# Patient Record
Sex: Male | Born: 1970 | Race: Black or African American | Hispanic: No | Marital: Single | Smoking: Current every day smoker
Health system: Southern US, Community
[De-identification: ages and names within clinical notes are randomized; demographics above are authoritative.]

## PROBLEM LIST (undated history)

## (undated) DIAGNOSIS — I10 Essential (primary) hypertension: Secondary | ICD-10-CM

## (undated) HISTORY — DX: Morbid (severe) obesity due to excess calories: E66.01

---

## 2005-02-20 ENCOUNTER — Emergency Department (HOSPITAL_COMMUNITY): Admission: EM | Admit: 2005-02-20 | Discharge: 2005-02-20 | Payer: Self-pay | Admitting: Emergency Medicine

## 2005-12-04 ENCOUNTER — Emergency Department (HOSPITAL_COMMUNITY): Admission: EM | Admit: 2005-12-04 | Discharge: 2005-12-04 | Payer: Self-pay | Admitting: Emergency Medicine

## 2015-11-10 ENCOUNTER — Encounter (HOSPITAL_COMMUNITY): Payer: Self-pay

## 2015-11-10 ENCOUNTER — Ambulatory Visit (INDEPENDENT_AMBULATORY_CARE_PROVIDER_SITE_OTHER): Payer: Self-pay

## 2015-11-10 ENCOUNTER — Ambulatory Visit (HOSPITAL_COMMUNITY)
Admission: EM | Admit: 2015-11-10 | Discharge: 2015-11-10 | Disposition: A | Discharge status: Home / Self Care | Payer: Self-pay | Attending: Family Medicine | Admitting: Family Medicine

## 2015-11-10 DIAGNOSIS — K5909 Other constipation: Secondary | ICD-10-CM

## 2015-11-10 DIAGNOSIS — R195 Other fecal abnormalities: Secondary | ICD-10-CM

## 2015-11-10 DIAGNOSIS — K591 Functional diarrhea: Secondary | ICD-10-CM

## 2015-11-10 DIAGNOSIS — I10 Essential (primary) hypertension: Secondary | ICD-10-CM

## 2015-11-10 HISTORY — DX: Essential (primary) hypertension: I10

## 2015-11-10 LAB — POCT I-STAT, CHEM 8
BUN: 9 mg/dL (ref 6–20)
CALCIUM ION: 1.13 mmol/L (ref 1.12–1.23)
CHLORIDE: 105 mmol/L (ref 101–111)
Creatinine, Ser: 0.9 mg/dL (ref 0.61–1.24)
GLUCOSE: 84 mg/dL (ref 65–99)
HCT: 46 % (ref 39.0–52.0)
Hemoglobin: 15.6 g/dL (ref 13.0–17.0)
Potassium: 3.8 mmol/L (ref 3.5–5.1)
SODIUM: 141 mmol/L (ref 135–145)
TCO2: 25 mmol/L (ref 0–100)

## 2015-11-10 MED ORDER — CLONIDINE HCL 0.1 MG PO TABS
0.1000 mg | ORAL_TABLET | Freq: Once | ORAL | Status: AC
  Administered 2015-11-10: 0.1 mg via ORAL

## 2015-11-10 MED ORDER — CLONIDINE HCL 0.1 MG PO TABS
ORAL_TABLET | ORAL | Status: AC
  Filled 2015-11-10: qty 1

## 2015-11-10 NOTE — Discharge Instructions (Signed)
It is a pleasure to see you today.    I am concerned about the changes in your bowel habits, and the straining with diarrhea and blood. THIS NEEDS TO BE EVALUATED FURTHER WITH THE HELP OF A PRIMARY DOCTOR.  I am giving you a contact to a local family practice that can work with you to establish care.   Your high blood pressure is also something that needs to be monitored and treated by a primary care doctor.   In the short-term, you may try an over-the-counter medicine such as MiraLax 1 heaping tablespoon mixed in a glass of water, by mouth, once daily.  EVEN IF THIS MAKES YOU FEEL BETTER, YOU NEED TO SEE A PRIMARY DOCTOR FOR FURTHER ASSESSMENT.

## 2015-11-10 NOTE — ED Provider Notes (Addendum)
CSN: [id number]     Arrival date & time 11/10/15  1450 History   First MD Initiated Contact with Patient 11/10/15 1616     Chief Complaint  Patient presents with  . Diarrhea   (Consider location/radiation/quality/duration/timing/severity/associated sxs/prior Treatment) Patient is a 45 y.o. male presenting with diarrhea. The history is provided by the patient. No language interpreter was used.  Diarrhea Associated symptoms: no abdominal pain, no chills, no diaphoresis and no vomiting    Patient presents for complaint of one week of bowel changes; watery diarrhea while also straining at the toilet to pass stool.  Has about 3-4 BMs/day, unsure if this is more than his usual baseline bowel habits.  Has seen a few drops of blood in toilet with bowel movement.  No fevers or chills, no abd pain, no N/V no changes in appetite. No weight changes.    Denies urinary frequency or urgency, no dysuria.  No blood in urine.   PMHx; History of HTN, was on medicine when he was incarcerated, last took in 2006 and doesn't remember the name of the med. Not seen by a doctor since that time.   PSurgHx no surgical history.  Social Hx; 1/2-1ppd cigarettes. Alcohol 2-3 drinks/day. No other drugs.   Past Medical History  Diagnosis Date  . Hypertension    History reviewed. No pertinent past surgical history. No family history on file. Social History  Substance Use Topics  . Smoking status: Current Every Day Smoker -- 0.50 packs/day    Types: Cigarettes  . Smokeless tobacco: Never Used  . Alcohol Use: 7.2 oz/week    12 Cans of beer per week    Review of Systems  Constitutional: Negative for chills, diaphoresis and fatigue.  Respiratory: Negative for cough and shortness of breath.   Cardiovascular: Negative for chest pain and palpitations.  Gastrointestinal: Positive for diarrhea, constipation and blood in stool. Negative for nausea, vomiting and abdominal pain.  Genitourinary: Negative for discharge and  penile pain.    Allergies  Review of patient's allergies indicates no known allergies.  Home Medications   Prior to Admission medications   Not on File   Meds Ordered and Administered this Visit  Medications - No data to display  BP 179/117 mmHg  Pulse 92  Temp(Src) 98.1 F (36.7 C) (Oral)  SpO2 98% No data found.   Physical Exam  Constitutional: He appears well-developed and well-nourished. No distress.  HENT:  Head: Normocephalic and atraumatic.  Mouth/Throat: Oropharynx is clear and moist. No oropharyngeal exudate.  Eyes: Conjunctivae and EOM are normal. Pupils are equal, round, and reactive to light. Right eye exhibits no discharge. Left eye exhibits no discharge.  Neck: Normal range of motion. Neck supple.  Cardiovascular: Normal rate, regular rhythm and normal heart sounds.   Pulmonary/Chest: Effort normal. No respiratory distress. He has no rales.  Scattered rhonchi, no rales on lung exam  Abdominal: Soft. Bowel sounds are normal. He exhibits no distension and no mass. There is no tenderness. There is no rebound and no guarding.  Mild tenderness along LLQ on deep palpation. No masses palpable.   Genitourinary: Prostate normal and penis normal. Guaiac positive stool.  Trace stool collected on DRE, trace-positive guaiac.   No stool impaction in rectal vault.   Lymphadenopathy:    He has no cervical adenopathy.  Skin: He is not diaphoretic.    ED Course  Procedures (including critical care time)  Labs Review Labs Reviewed  POCT I-STAT, CHEM 8    Imaging  Review No results found.   Visual Acuity Review  Right Eye Distance:   Left Eye Distance:   Bilateral Distance:    Right Eye Near:   Left Eye Near:    Bilateral Near:         MDM   1. Functional diarrhea   2. Other constipation   3. Essential hypertension   4. Heme positive stool    Patient with changes in bowel patterns, heme-positive stool today and by history with small blood in stool.   KUB negative for heavy stool burden, films reviewed by me.   iStat without anemia on H/H, no acidosis on TCO2, no renal failure.   Discussed with patient my concern for underlying causes of his bowel changes. May use fiber supplements for acute constipation, however the presence of blood in stool requires further workup with GI for endoscopic evaluation.  I discussed this explicitly with the patient, specifically concern for possible CRC.   HTN, longstanding, very high today. He needs to establish care with primary care physician. Given contact information for this.   Advised he needs further follow up with a primary doctor, for referral to gastroenterologist for further evaluation.   Patient given Clonidine 0.1mg  po in [facility]; denies chest pain, visual changes or shortness of breath. Suspect longstanding chronic essential HTN. Discussed with him the need for primary care to manage this.    Barbaraann Barthel, MD 11/10/15 1702  Barbaraann Barthel, MD 11/10/15 [phone]

## 2015-11-10 NOTE — ED Notes (Signed)
Patient states he has been having diarrhea x1 week and stomach pain, no nausea or vomiting  No acute distress

## 2017-03-04 ENCOUNTER — Emergency Department (HOSPITAL_COMMUNITY)
Admission: EM | Admit: 2017-03-04 | Discharge: 2017-03-04 | Disposition: A | Discharge status: Home / Self Care | Payer: Self-pay | Attending: Emergency Medicine | Admitting: Emergency Medicine

## 2017-03-04 ENCOUNTER — Encounter (HOSPITAL_COMMUNITY): Payer: Self-pay

## 2017-03-04 DIAGNOSIS — I10 Essential (primary) hypertension: Secondary | ICD-10-CM | POA: Insufficient documentation

## 2017-03-04 DIAGNOSIS — Y999 Unspecified external cause status: Secondary | ICD-10-CM | POA: Insufficient documentation

## 2017-03-04 DIAGNOSIS — Y939 Activity, unspecified: Secondary | ICD-10-CM | POA: Insufficient documentation

## 2017-03-04 DIAGNOSIS — F17210 Nicotine dependence, cigarettes, uncomplicated: Secondary | ICD-10-CM | POA: Insufficient documentation

## 2017-03-04 DIAGNOSIS — T63461A Toxic effect of venom of wasps, accidental (unintentional), initial encounter: Secondary | ICD-10-CM | POA: Insufficient documentation

## 2017-03-04 DIAGNOSIS — Y929 Unspecified place or not applicable: Secondary | ICD-10-CM | POA: Insufficient documentation

## 2017-03-04 MED ORDER — DILTIAZEM HCL ER 60 MG PO CP12: 60.0000 mg | ORAL_CAPSULE | Freq: Two times a day (BID) | ORAL | 0 refills | Status: DC

## 2017-03-04 MED ORDER — RANITIDINE HCL 150 MG/10ML PO SYRP
150.0000 mg | ORAL_SOLUTION | Freq: Once | ORAL | Status: AC
  Administered 2017-03-04: 150 mg via ORAL
  Filled 2017-03-04: qty 10

## 2017-03-04 NOTE — ED Triage Notes (Addendum)
Patient stung by bee today to lip and scalp. Took benadryl  pta. Reports lip swelling around bite but no tongue swelling and no resp. Issues. Alert and oriented, NAD. Ice pack provided

## 2017-03-04 NOTE — ED Provider Notes (Signed)
[facility]-EMERGENCY DEPT Provider Note   CSN: [id number] Arrival date & time: 03/04/17  1239     History   Chief Complaint No chief complaint on file.   HPI Todd Hicks is a 46 y.o. male.  HPI   46 year old male presents today with bee stings.  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  He is [place] on the upper lip and the top of his head.  He notes his upper lip swelled up significantly, he took 2 Benadryl prior to arrival.  Patient notes he is not allergic to bees that he knows of, no other significant allergies.  He denies any intraoral involvement, chest pain, shortness of breath, or any other concerning signs or symptoms.  Patient notes the swelling in the lip has significantly improved prior to my evaluation.  Patient also has a long-standing history of hypertension.  He notes he was on blood pressure medication back in 2006, has not been followed by primary care.  He was seen at a health clinic and instructed to follow-up with a primary care doctor.  He notes he was unable to afford insurance and was not able to be seen.   Past Medical History:  Diagnosis Date  . Hypertension     There are no active problems to display for this patient.   History reviewed. No pertinent surgical history.     Home Medications    Prior to Admission medications   Medication Sig Start Date End Date Taking? Authorizing Provider  diltiazem (CARDIZEM SR) 60 MG 12 hr capsule Take 1 capsule (60 mg total) by mouth 2 (two) times daily. 03/04/17   Eyvonne MechanicHedges, Shonta Bourque, PA-C    Family History No family history on file.  Social History Social History  Substance Use Topics  . Smoking status: Current Every Day Smoker    Packs/day: 0.50    Types: Cigarettes  . Smokeless tobacco: Never Used  . Alcohol use 7.2 oz/week    12 Cans of beer per week     Allergies   Patient has no known allergies.   Review of Systems Review of Systems    All other systems reviewed and are negative.    Physical Exam Updated Vital Signs BP (!) 179/109   Pulse 84   Temp 98.9 F (37.2 C) (Oral)   Resp 18   SpO2 99%   Physical Exam  Constitutional: He is oriented to person, place, and time. He appears well-developed and well-nourished.  HENT:  Head: Normocephalic and atraumatic.  Minor swelling to the upper lip, no obvious signs of trauma were embedded stingers-no intraoral involvement, no surrounding swelling, no signs of respiratory distress  Eyes: Pupils are equal, round, and reactive to light. Conjunctivae are normal. Right eye exhibits no discharge. Left eye exhibits no discharge. No scleral icterus.  Neck: Normal range of motion. No JVD present. No tracheal deviation present.  Pulmonary/Chest: Effort normal and breath sounds normal. No stridor. No respiratory distress. He has no wheezes. He has no rales. He exhibits no tenderness.  Neurological: He is alert and oriented to person, place, and time. Coordination normal.  Skin: No rash noted.  Psychiatric: He has a normal mood and affect. His behavior is normal. Judgment and thought content normal.  Nursing note and vitals reviewed.    ED Treatments / Results  Labs (all labs ordered are listed, but only abnormal results are displayed) Labs Reviewed - No data to display  EKG  EKG Interpretation None       Radiology No results found.  Procedures Procedures (including critical care time)  Medications Ordered in ED Medications  ranitidine (ZANTAC) 150 MG/10ML syrup 150 mg (150 mg Oral Given 03/04/17 1511)     Initial Impression / Assessment and Plan / ED Course  I have reviewed the triage vital signs and the nursing notes.  Pertinent labs & imaging results that were available during my care of the patient were reviewed by me and considered in my medical decision making (see chart for details).     46 year old male with a wasp sting to his upper lip and head.   Patient reports dramatic improvements prior to my evaluation, continued improvements here in the ED.  He has no signs of airway involvement or anaphylaxis.  Patient will be discharged with return precautions, follow-up information.  Patient verbalized understanding and agreement to today's plan had no further questions or concerns.  Patient also noted to be hypertensive here.  He is not taking medications at home as he is not been able to follow up with the primary care.  Patient will be started on diltiazem with instructions to closely follow up with primary care for evaluation and management.  Patient assured his follow-up.  Final Clinical Impressions(s) / ED Diagnoses   Final diagnoses:  Wasp sting, accidental or unintentional, initial encounter    New Prescriptions Discharge Medication List as of 03/04/2017  4:39 PM    START taking these medications   Details  diltiazem (CARDIZEM SR) 60 MG 12 hr capsule Take 1 capsule (60 mg total) by mouth 2 (two) times daily., Starting Tue 03/04/2017, Print         Marrell Dicaprio, [place], PA-C 03/04/17 2027    Pricilla Loveless, MD 03/07/17 1136

## 2017-03-04 NOTE — ED Notes (Signed)
Pt states he understands instructions. Home stable with family. 

## 2017-03-04 NOTE — Discharge Instructions (Signed)
Please read attached information. If you experience any new or worsening signs or symptoms please return to the emergency room for evaluation. Please follow-up with your primary care provider or specialist as discussed.  °

## 2017-07-02 IMAGING — DX DG ABDOMEN 1V
1 series · 1 of 1 positions shown · non-contrast
Comparison: None.

CLINICAL DATA: Constipation with watery stool. Heme-positive stool.

EXAM:
ABDOMEN - 1 VIEW

[abdomen kub]
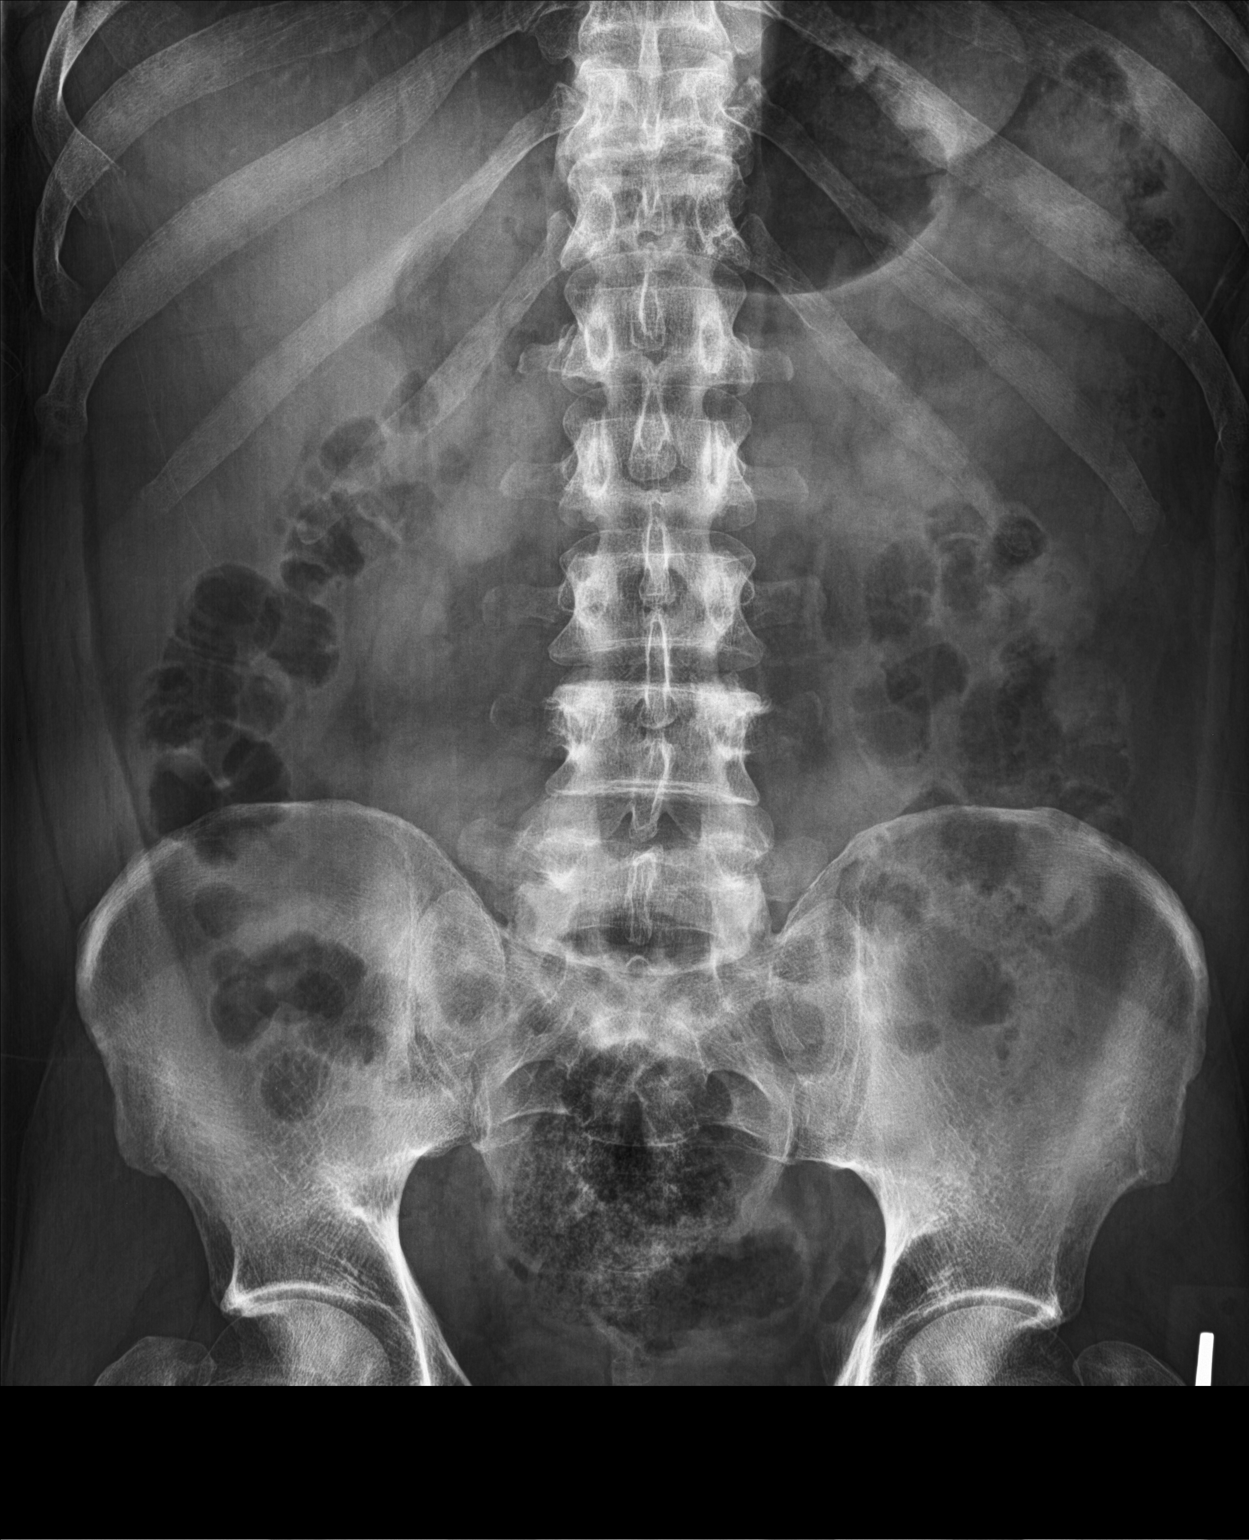

[1 of 1 positions shown; findings below may reference images not displayed]

FINDINGS: The bowel gas pattern is normal. No radio-opaque calculi or other
significant radiographic abnormality are seen.
IMPRESSION: Negative.

## 2018-03-11 ENCOUNTER — Emergency Department (HOSPITAL_COMMUNITY)
Admission: EM | Admit: 2018-03-11 | Discharge: 2018-03-11 | Disposition: A | Discharge status: Home / Self Care | Payer: Self-pay | Attending: Emergency Medicine | Admitting: Emergency Medicine

## 2018-03-11 ENCOUNTER — Encounter (HOSPITAL_COMMUNITY): Payer: Self-pay | Admitting: Emergency Medicine

## 2018-03-11 ENCOUNTER — Emergency Department (HOSPITAL_COMMUNITY): Payer: Self-pay

## 2018-03-11 ENCOUNTER — Other Ambulatory Visit: Payer: Self-pay

## 2018-03-11 DIAGNOSIS — I10 Essential (primary) hypertension: Secondary | ICD-10-CM | POA: Insufficient documentation

## 2018-03-11 DIAGNOSIS — F17210 Nicotine dependence, cigarettes, uncomplicated: Secondary | ICD-10-CM | POA: Insufficient documentation

## 2018-03-11 DIAGNOSIS — Z79899 Other long term (current) drug therapy: Secondary | ICD-10-CM | POA: Insufficient documentation

## 2018-03-11 LAB — CBC
HEMATOCRIT: 45.8 % (ref 39.0–52.0)
HEMOGLOBIN: 15.2 g/dL (ref 13.0–17.0)
MCH: 32.6 pg (ref 26.0–34.0)
MCHC: 33.2 g/dL (ref 30.0–36.0)
MCV: 98.3 fL (ref 78.0–100.0)
Platelets: 232 10*3/uL (ref 150–400)
RBC: 4.66 MIL/uL (ref 4.22–5.81)
RDW: 13.2 % (ref 11.5–15.5)
WBC: 6.1 10*3/uL (ref 4.0–10.5)

## 2018-03-11 LAB — I-STAT TROPONIN, ED: TROPONIN I, POC: 0 ng/mL (ref 0.00–0.08)

## 2018-03-11 LAB — BASIC METABOLIC PANEL
ANION GAP: 12 (ref 5–15)
BUN: 9 mg/dL (ref 6–20)
CALCIUM: 9.2 mg/dL (ref 8.9–10.3)
CO2: 22 mmol/L (ref 22–32)
Chloride: 105 mmol/L (ref 98–111)
Creatinine, Ser: 0.88 mg/dL (ref 0.61–1.24)
GFR calc Af Amer: >60 mL/min (ref >60)
GLUCOSE: 96 mg/dL (ref 70–99)
POTASSIUM: 4.2 mmol/L (ref 3.5–5.1)
SODIUM: 139 mmol/L (ref 135–145)

## 2018-03-11 MED ORDER — HYDROCHLOROTHIAZIDE 25 MG PO TABS
25.0000 mg | ORAL_TABLET | Freq: Every day | ORAL | Status: DC
  Administered 2018-03-11: 25 mg via ORAL
  Filled 2018-03-11: qty 1

## 2018-03-11 MED ORDER — HYDROCHLOROTHIAZIDE 12.5 MG PO TABS: 12.5000 mg | ORAL_TABLET | Freq: Every day | ORAL | 0 refills | Status: DC

## 2018-03-11 NOTE — ED Provider Notes (Signed)
[facility]   CSN: [id number] Arrival date & time: 03/11/18  1124     History   Chief Complaint Chief Complaint  Patient presents with  . Hypertension    HPI Daune PerchKevin Ridlon is a 47 y.o. male.  HPI  47 year old male presents today with complaints of hypertension.  Patient notes that he has had elevated blood pressures in the past, he went to a community clinic today and was noted to be hypertensive in the 220 systolic range.  They recommended coming to the emergency room for evaluation at that time.  Patient notes that he is asymptomatic, he has no chest pain vision changes, shortness of breath, abdominal pain, headache.  Vision reports he is not currently on blood pressure medication.  He does note a 20 pack-year history of smoking, notes he drinks 4-5 beers daily.    Past Medical History:  Diagnosis Date  . Hypertension     There are no active problems to display for this patient.   History reviewed. No pertinent surgical history.      Home Medications    Prior to Admission medications   Medication Sig Start Date End Date Taking? Authorizing Provider  diltiazem (CARDIZEM SR) 60 MG 12 hr capsule Take 1 capsule (60 mg total) by mouth 2 (two) times daily. 03/04/17   Nyrie Sigal, Tinnie GensJeffrey, PA-C  hydrochlorothiazide (HYDRODIURIL) 12.5 MG tablet Take 1 tablet (12.5 mg total) by mouth daily. 03/11/18   Eyvonne MechanicHedges, Mari[place]carmen Corella, PA-C    Family History No family history on file.  Social History Social History   Tobacco Use  . Smoking status: Current Every Day Smoker    Packs/day: 0.50    Types: Cigarettes  . Smokeless tobacco: Never Used  Substance Use Topics  . Alcohol use: Yes    Alcohol/week: 4.0 standard drinks    Types: 4 Cans of beer per week  . Drug use: Yes    Types: Marijuana     Allergies   Patient has no known allergies.   Review of Systems Review of Systems  All other systems reviewed and are  negative.   Physical Exam Updated Vital Signs BP (!) 168/117   Pulse 65   Temp 98.2 F (36.8 C) (Oral)   Resp 18   Ht 5\' 5"  (1.651 m)   Wt 100.2 kg   SpO2 99%   BMI 36.78 kg/m   Physical Exam  Constitutional: He is oriented to person, place, and time. He appears well-developed and well-nourished.  HENT:  Head: Normocephalic and atraumatic.  Eyes: Pupils are equal, round, and reactive to light. Conjunctivae are normal. Right eye exhibits no discharge. Left eye exhibits no discharge. No scleral icterus.  Neck: Normal range of motion. No JVD present. No tracheal deviation present.  Cardiovascular: Normal rate, regular rhythm, normal heart sounds and intact distal pulses. Exam reveals no gallop and no friction rub.  No murmur heard. Pulmonary/Chest: Effort normal and breath sounds normal. No stridor. No respiratory distress. He has no wheezes. He has no rales. He exhibits no tenderness.  Musculoskeletal: He exhibits no edema.  Neurological: He is alert and oriented to person, place, and time. Coordination normal.  Psychiatric: He has a normal mood and affect. His behavior is normal. Judgment and thought content normal.  Nursing note and vitals reviewed.    ED Treatments / Results  Labs (all labs ordered are listed, but only abnormal results are displayed) Labs Reviewed  BASIC METABOLIC PANEL  CBC  I-STAT  TROPONIN, ED    EKG None   ED EKG normal sinus rhythm 79 bpm no ST elevation or depression  Radiology Dg Chest 2 View  Result Date: 03/11/2018 CLINICAL DATA:  Hypertension EXAM: CHEST - 2 VIEW COMPARISON:  None. FINDINGS: Lungs are clear. Heart size and pulmonary vascularity are normal. No adenopathy. There is an old healed fracture of the posterolateral right seventh rib. IMPRESSION: No edema or consolidation. Electronically Signed   By: Bretta Bang III M.D.   On: 03/11/2018 12:23    Procedures Procedures (including critical care time)  Medications Ordered in  ED Medications  hydrochlorothiazide (HYDRODIURIL) tablet 25 mg (25 mg Oral Given 03/11/18 1317)     Initial Impression / Assessment and Plan / ED Course  I have reviewed the triage vital signs and the nursing notes.  Pertinent labs & imaging results that were available during my care of the patient were reviewed by me and considered in my medical decision making (see chart for details).    Labs: I-STAT troponin, BMP, CBC  Imaging: DG chest 2 view  Consults:  Therapeutics:  Discharge Meds:   Assessment/Plan: 47 year old male presents today with complaints of hypertension.  Patient is asymptomatic, and is not currently on any blood pressure medication.  Patient will be started on HCTZ.  At the time my evaluation his blood pressure is 170/114.  I do feel patient is stable for outpatient management, he will be given a prescription for HCTZ encouraged follow-up with primary care for reassessment and ongoing hypertensive management.  Strict return precautions given.  Patient verbalized understanding and agreement to today's plan had no further questions or concerns at the time discharge.   Final Clinical Impressions(s) / ED Diagnoses   Final diagnoses:  Hypertension, unspecified type    ED Discharge Orders         Ordered    hydrochlorothiazide (HYDRODIURIL) 12.5 MG tablet  Daily     03/11/18 1419           Eyvonne Mechanic, PA-C 03/11/18 1424    Benjiman Core, MD 03/11/18 2228

## 2018-03-11 NOTE — Discharge Instructions (Addendum)
Please read attached information. If you experience any new or worsening signs or symptoms please return to the emergency room for evaluation. Please follow-up with your primary care provider or specialist as discussed. Please use medication prescribed only as directed and discontinue taking if you have any concerning signs or symptoms.   °

## 2018-03-11 NOTE — ED Triage Notes (Signed)
[facility]- was on medicine, but none since 2006-- was given an rx here mnot long ago, but only took for 1 month, pt is asymptomatic.

## 2018-08-31 ENCOUNTER — Emergency Department (HOSPITAL_COMMUNITY)
Admission: EM | Admit: 2018-08-31 | Discharge: 2018-08-31 | Disposition: A | Discharge status: Home / Self Care | Payer: Self-pay | Attending: Emergency Medicine | Admitting: Emergency Medicine

## 2018-08-31 ENCOUNTER — Emergency Department (HOSPITAL_COMMUNITY): Payer: Self-pay

## 2018-08-31 ENCOUNTER — Other Ambulatory Visit: Payer: Self-pay

## 2018-08-31 ENCOUNTER — Encounter (HOSPITAL_COMMUNITY): Payer: Self-pay | Admitting: *Deleted

## 2018-08-31 DIAGNOSIS — Y929 Unspecified place or not applicable: Secondary | ICD-10-CM | POA: Insufficient documentation

## 2018-08-31 DIAGNOSIS — S93401A Sprain of unspecified ligament of right ankle, initial encounter: Secondary | ICD-10-CM

## 2018-08-31 DIAGNOSIS — I10 Essential (primary) hypertension: Secondary | ICD-10-CM

## 2018-08-31 DIAGNOSIS — F17210 Nicotine dependence, cigarettes, uncomplicated: Secondary | ICD-10-CM | POA: Insufficient documentation

## 2018-08-31 DIAGNOSIS — Y939 Activity, unspecified: Secondary | ICD-10-CM | POA: Insufficient documentation

## 2018-08-31 DIAGNOSIS — Y33XXXA Other specified events, undetermined intent, initial encounter: Secondary | ICD-10-CM | POA: Insufficient documentation

## 2018-08-31 DIAGNOSIS — Y998 Other external cause status: Secondary | ICD-10-CM | POA: Insufficient documentation

## 2018-08-31 MED ORDER — ACETAMINOPHEN 325 MG PO TABS
650.0000 mg | ORAL_TABLET | Freq: Once | ORAL | Status: AC
Start: 2018-08-31 — End: 2018-08-31
  Administered 2018-08-31: 650 mg via ORAL
  Filled 2018-08-31: qty 2

## 2018-08-31 NOTE — ED Provider Notes (Signed)
[facility] EMERGENCY DEPARTMENT Provider Note   CSN: [id number] Arrival date & time: 08/31/18  1141    History   Chief Complaint Chief Complaint  Patient presents with  . Ankle Pain    HPI Todd Hicks is a 48 y.o. male presenting for evaluation of right ankle pain.  Patient states 2 days ago he was walking on uneven ground when his right ankle sharply inverted.  He reports acute onset pain, mostly of the dorsal foot/ankle.  He denies numbness or tingling.  He denies hitting his head or loss of consciousness.  He denies injury elsewhere.  He has not taken anything for pain including Tylenol ibuprofen.  Patient states he was told he was neurologic back to work until he was evaluated.  He denies numbness or tingling.  He denies history of previous problems with this ankle. Patient states his primary care doctor is managing his blood pressure.  She started him on a medication, but told him to follow-up.  He has not done so since.  He has taken his medication today.  He denies headache, vision loss, slurred speech, numbness/weakness.     HPI  Past Medical History:  Diagnosis Date  . Hypertension     There are no active problems to display for this patient.   History reviewed. No pertinent surgical history.      Home Medications    Prior to Admission medications   Medication Sig Start Date End Date Taking? Authorizing Provider  diltiazem (CARDIZEM SR) 60 MG 12 hr capsule Take 1 capsule (60 mg total) by mouth 2 (two) times daily. 03/04/17   Hedges, Tinnie Gens, PA-C  hydrochlorothiazide (HYDRODIURIL) 12.5 MG tablet Take 1 tablet (12.5 mg total) by mouth daily. 03/11/18   Eyvonne Mechanic, PA-C    Family History History reviewed. No pertinent family history.  Social History Social History   Tobacco Use  . Smoking status: Current Every Day Smoker    Packs/day: 0.50    Types: Cigarettes  . Smokeless tobacco: Never Used  Substance Use Topics  . Alcohol use:  Yes    Alcohol/week: 4.0 standard drinks    Types: 4 Cans of beer per week  . Drug use: Yes    Types: Marijuana     Allergies   Patient has no known allergies.   Review of Systems Review of Systems  Musculoskeletal: Positive for arthralgias.  Neurological: Negative for numbness.     Physical Exam Updated Vital Signs BP (!) 159/115 (BP Location: Right Arm)   Pulse 82   Temp 98.9 F (37.2 C) (Oral)   Resp 18   Ht 5\' 6"  (1.676 m)   Wt 100.2 kg   SpO2 97%   BMI 35.67 kg/m   Physical Exam Vitals signs and nursing note reviewed.  Constitutional:      General: He is not in acute distress.    Appearance: He is well-developed.     Comments: Appears nontoxic  HENT:     Head: Normocephalic and atraumatic.  Neck:     Musculoskeletal: Normal range of motion.  Pulmonary:     Effort: Pulmonary effort is normal.  Abdominal:     General: There is no distension.  Musculoskeletal: Normal range of motion.        General: Swelling and tenderness present.     Comments: Tenderness palpation of the anterior tarsals of the right foot.  No tenderness palpation of the medial or lateral left malleolus.  Achilles tendon palpable and intact.  Pedal pulses intact bilaterally.  Good cap refill of distal toes.  Sensation intact.  Patient is ambulatory.  Increased pain with plantar and dorsiflexion of the foot.  Minimal swelling.  No tenderness palpation of calf or lower leg.  Skin:    General: Skin is warm.     Capillary Refill: Capillary refill takes less than 2 seconds.     Findings: No rash.  Neurological:     Mental Status: He is alert and oriented to person, place, and time.      ED Treatments / Results  Labs (all labs ordered are listed, but only abnormal results are displayed) Labs Reviewed - No data to display  EKG None  Radiology Dg Ankle Complete Right  Result Date: 08/31/2018 CLINICAL DATA:  48 y/o M; right ankle and foot pain for 2 days after a rolling injury of the  ankle. EXAM: RIGHT ANKLE - COMPLETE 3+ VIEW; RIGHT FOOT COMPLETE - 3+ VIEW COMPARISON:  None. FINDINGS: Right ankle: There is no evidence of fracture, dislocation, or joint effusion. There is no evidence of arthropathy or other focal bone abnormality. Soft tissues are unremarkable. Right foot: There is no evidence of acute fracture, dislocation, or joint effusion. Chronic fracture deformities of the fifth metatarsal and proximal phalanx. There is no evidence of arthropathy or other focal bone abnormality. Soft tissues are unremarkable. IMPRESSION: No acute fracture or dislocation identified. Electronically Signed   By: Mitzi Hansen M.D.   On: 08/31/2018 13:47   Dg Foot Complete Right  Result Date: 08/31/2018 CLINICAL DATA:  48 y/o M; right ankle and foot pain for 2 days after a rolling injury of the ankle. EXAM: RIGHT ANKLE - COMPLETE 3+ VIEW; RIGHT FOOT COMPLETE - 3+ VIEW COMPARISON:  None. FINDINGS: Right ankle: There is no evidence of fracture, dislocation, or joint effusion. There is no evidence of arthropathy or other focal bone abnormality. Soft tissues are unremarkable. Right foot: There is no evidence of acute fracture, dislocation, or joint effusion. Chronic fracture deformities of the fifth metatarsal and proximal phalanx. There is no evidence of arthropathy or other focal bone abnormality. Soft tissues are unremarkable. IMPRESSION: No acute fracture or dislocation identified. Electronically Signed   By: Mitzi Hansen M.D.   On: 08/31/2018 13:47    Procedures Procedures (including critical care time)  Medications Ordered in ED Medications  acetaminophen (TYLENOL) tablet 650 mg (650 mg Oral Given 08/31/18 1409)     Initial Impression / Assessment and Plan / ED Course  I have reviewed the triage vital signs and the nursing notes.  Pertinent labs & imaging results that were available during my care of the patient were reviewed by me and considered in my medical decision  making (see chart for details).        Pt presenting for evaluation of ankle pain after twisting it.  Physical exam reassuring, he is neurovascularly intact.  As pain is overlying the tarsals, will order x-ray for further evaluation.  X-ray viewed interpreted by me, no fracture dislocation.  Discussed findings with patient.  Discussed symptomatic treatment with ASO, Tylenol, ice, and elevation.  Patient's blood pressure elevated throughout his ER visit, although improved over time.  Discussed importance of following up with his PCP regarding blood pressure management.  No sign of endorgan damage at this time.  At this time, patient appears safe for discharge.  Return precautions given.  Patient states he understands and agrees to plan.   Final Clinical Impressions(s) / ED Diagnoses  Final diagnoses:  Sprain of right ankle, unspecified ligament, initial encounter  Hypertension, unspecified type    ED Discharge Orders    None       Alveria Apley, PA-C 08/31/18 1414    Charlynne Pander, MD 08/31/18 [phone]

## 2018-08-31 NOTE — ED Triage Notes (Signed)
Pt reports he turned his ankle 2 days ago . Pt reports the Rt ankle still hurts. Pt also reports his ankles are swollen.

## 2018-08-31 NOTE — Discharge Instructions (Addendum)
Take Tylenol 3-4 times a day.  Take 1000 mg, to the exception of pills, at a time. Use ice to help with pain and swelling. Keep your foot elevated when able to help with swelling. Your left to go back to work, you will not cause further damage to your foot.  However, the more you walk on it the more will hurt by the end of the day.  This will continue for the next several days. Follow-up with your primary care doctor for further evaluation and management of your blood pressure. Return to the emergency room if you develop numbness of your foot, vision loss, slurred speech, weakness of the legs, or any new, worsening, concerning symptoms.

## 2018-08-31 NOTE — ED Notes (Signed)
Patient verbalizes understanding of discharge instructions . Opportunity for questions and answers were provided . Armband removed by staff ,Pt discharged from ED. W/C  offered at D/C  and Declined W/C at D/C and was escorted to lobby by RN.  

## 2018-08-31 NOTE — ED Notes (Signed)
Patient transported to X-ray 

## 2020-04-22 IMAGING — DX RIGHT FOOT COMPLETE - 3+ VIEW
3 series · 3 of 3 positions shown · non-contrast
Comparison: None.

CLINICAL DATA: 47 y/o M; right ankle and foot pain for 2 days after
a rolling injury of the ankle.

EXAM:
RIGHT ANKLE - COMPLETE 3+ VIEW; RIGHT FOOT COMPLETE - 3+ VIEW

[x foot lat right]
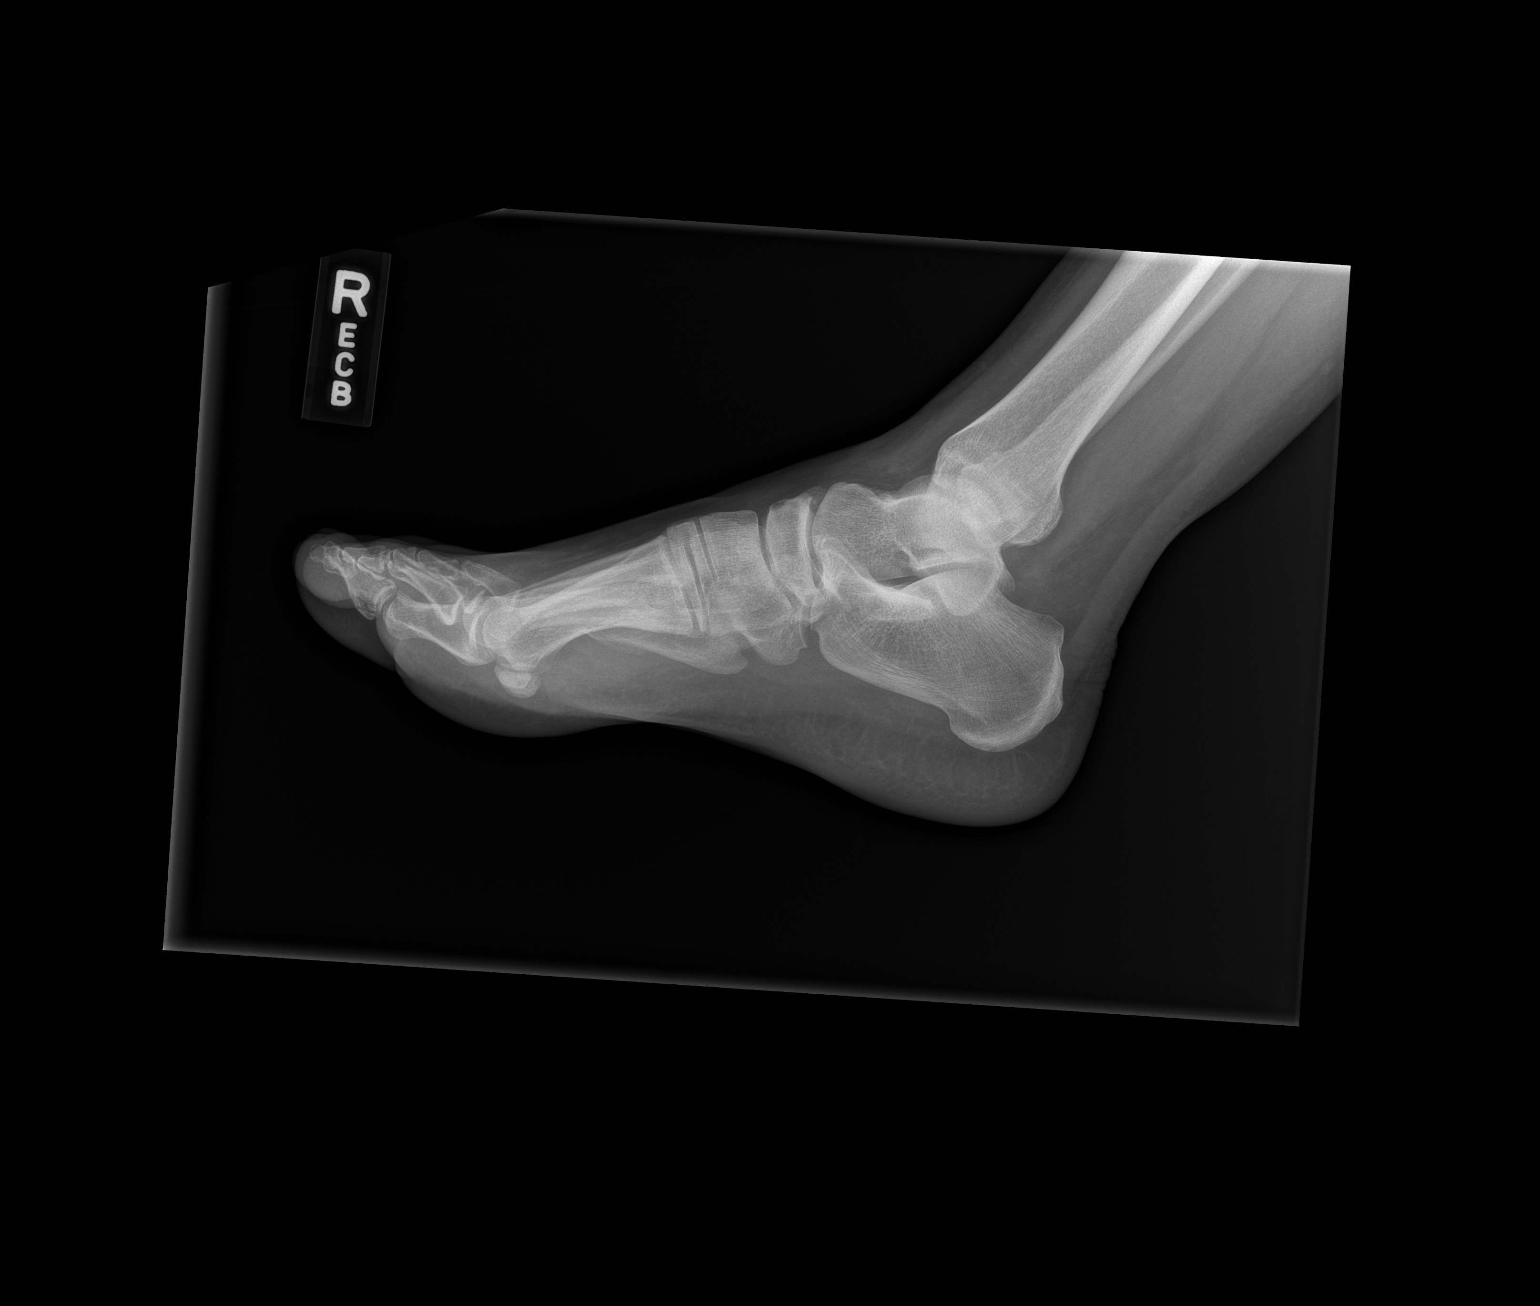

[x foot ap right]
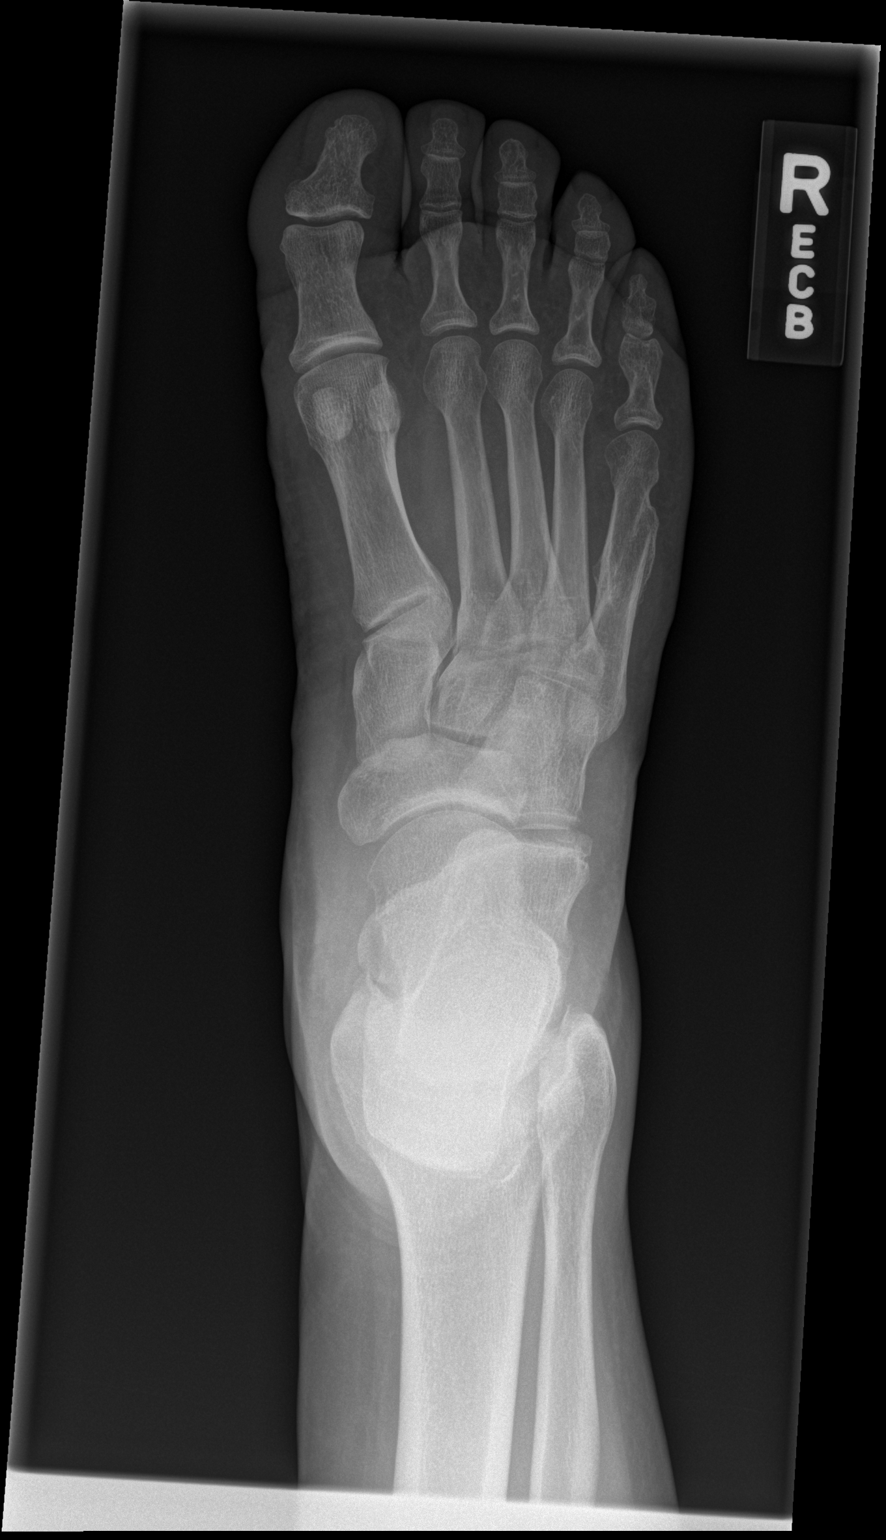

[x foot obl right]
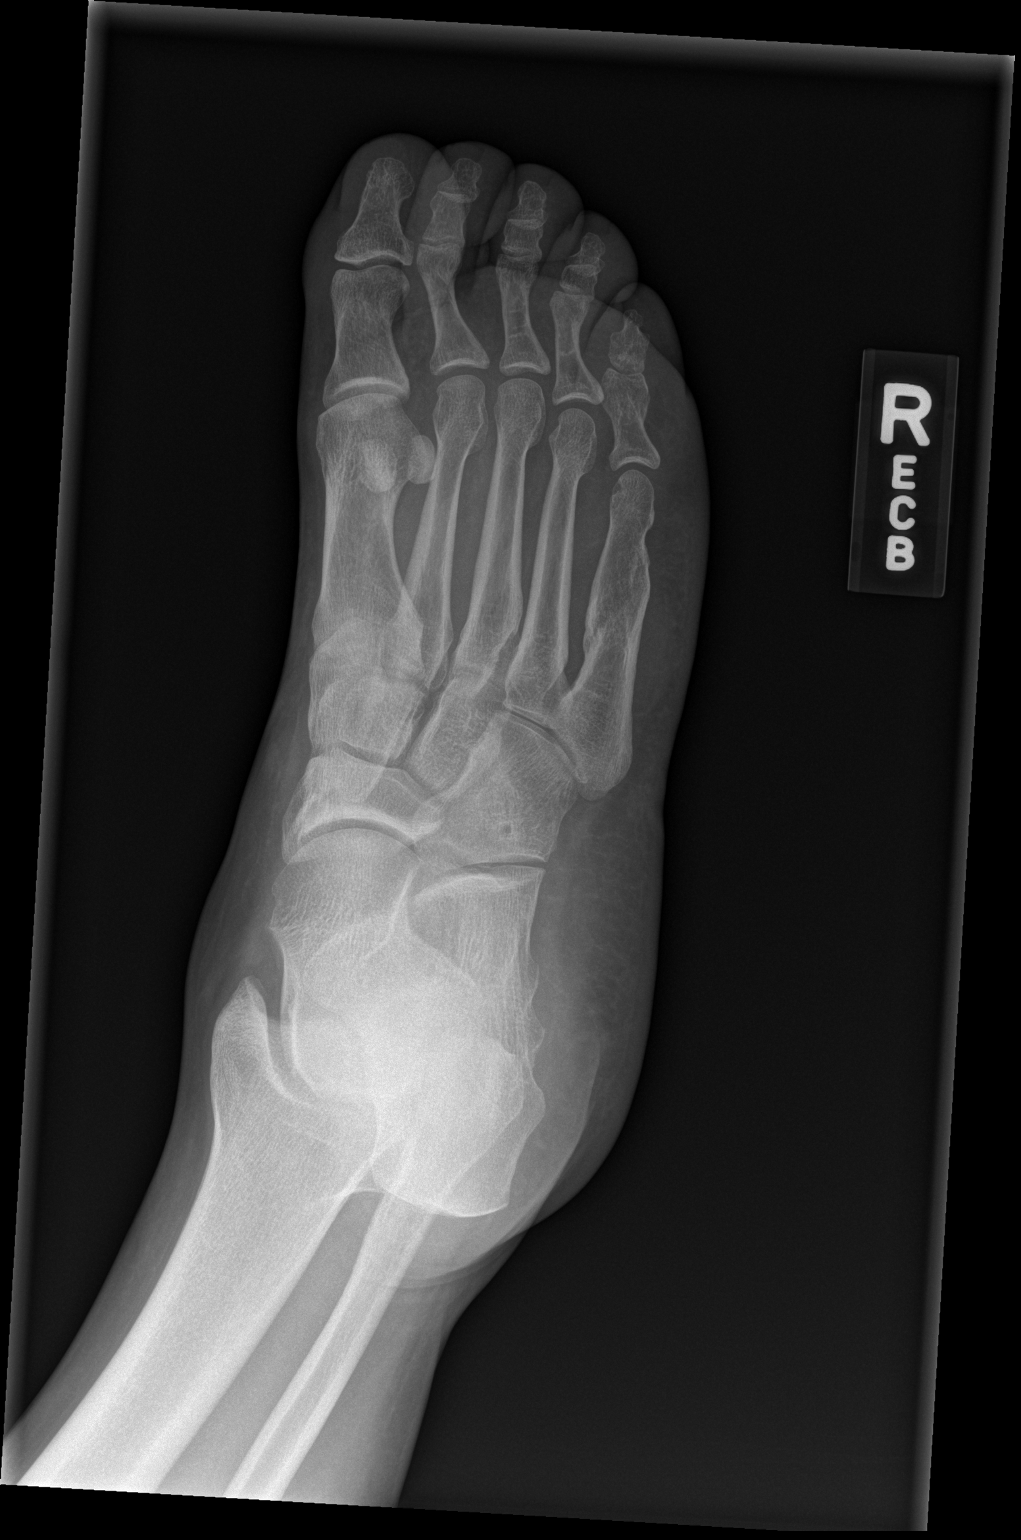

[3 of 3 positions shown; findings below may reference images not displayed]

FINDINGS: Right ankle:

There is no evidence of fracture, dislocation, or joint effusion.
There is no evidence of arthropathy or other focal bone abnormality.
Soft tissues are unremarkable.

Right foot:

There is no evidence of acute fracture, dislocation, or joint
effusion. Chronic fracture deformities of the fifth metatarsal and
proximal phalanx. There is no evidence of arthropathy or other focal
bone abnormality. Soft tissues are unremarkable.
IMPRESSION: No acute fracture or dislocation identified.

## 2023-04-07 ENCOUNTER — Encounter: Payer: Self-pay | Admitting: Emergency Medicine

## 2023-04-07 ENCOUNTER — Emergency Department (HOSPITAL_COMMUNITY)
Admission: EM | Admit: 2023-04-07 | Discharge: 2023-04-08 | Discharge status: Against Medical Advice | Payer: Self-pay | Attending: Emergency Medicine | Admitting: Emergency Medicine

## 2023-04-07 ENCOUNTER — Other Ambulatory Visit: Payer: Self-pay

## 2023-04-07 ENCOUNTER — Ambulatory Visit
Admission: EM | Admit: 2023-04-07 | Discharge: 2023-04-07 | Disposition: A | Discharge status: Home / Self Care | Payer: Self-pay

## 2023-04-07 ENCOUNTER — Emergency Department (HOSPITAL_COMMUNITY): Payer: Self-pay

## 2023-04-07 ENCOUNTER — Encounter (HOSPITAL_COMMUNITY): Payer: Self-pay | Admitting: Emergency Medicine

## 2023-04-07 DIAGNOSIS — I160 Hypertensive urgency: Secondary | ICD-10-CM

## 2023-04-07 DIAGNOSIS — I10 Essential (primary) hypertension: Secondary | ICD-10-CM | POA: Insufficient documentation

## 2023-04-07 DIAGNOSIS — W010XXA Fall on same level from slipping, tripping and stumbling without subsequent striking against object, initial encounter: Secondary | ICD-10-CM | POA: Insufficient documentation

## 2023-04-07 DIAGNOSIS — S82892A Other fracture of left lower leg, initial encounter for closed fracture: Secondary | ICD-10-CM | POA: Insufficient documentation

## 2023-04-07 DIAGNOSIS — R030 Elevated blood-pressure reading, without diagnosis of hypertension: Secondary | ICD-10-CM

## 2023-04-07 LAB — CBC WITH DIFFERENTIAL/PLATELET
Abs Immature Granulocytes: 0.04 10*3/uL (ref 0.00–0.07)
Basophils Absolute: 0 10*3/uL (ref 0.0–0.1)
Basophils Relative: 0 %
Eosinophils Absolute: 0 10*3/uL (ref 0.0–0.5)
Eosinophils Relative: 0 %
HCT: 46.3 % (ref 39.0–52.0)
Hemoglobin: 15.4 g/dL (ref 13.0–17.0)
Immature Granulocytes: 0 %
Lymphocytes Relative: 12 %
Lymphs Abs: 1.4 10*3/uL (ref 0.7–4.0)
MCH: 31.8 pg (ref 26.0–34.0)
MCHC: 33.3 g/dL (ref 30.0–36.0)
MCV: 95.5 fL (ref 80.0–100.0)
Monocytes Absolute: 1.1 10*3/uL — ABNORMAL HIGH (ref 0.1–1.0)
Monocytes Relative: 9 %
Neutro Abs: 9.5 10*3/uL — ABNORMAL HIGH (ref 1.7–7.7)
Neutrophils Relative %: 79 %
Platelets: 235 10*3/uL (ref 150–400)
RBC: 4.85 MIL/uL (ref 4.22–5.81)
RDW: 13.1 % (ref 11.5–15.5)
WBC: 12.1 10*3/uL — ABNORMAL HIGH (ref 4.0–10.5)
nRBC: 0 % (ref 0.0–0.2)

## 2023-04-07 LAB — BASIC METABOLIC PANEL
Anion gap: 16 — ABNORMAL HIGH (ref 5–15)
BUN: 13 mg/dL (ref 6–20)
CO2: 19 mmol/L — ABNORMAL LOW (ref 22–32)
Calcium: 9.4 mg/dL (ref 8.9–10.3)
Chloride: 103 mmol/L (ref 98–111)
Creatinine, Ser: 1.04 mg/dL (ref 0.61–1.24)
GFR, Estimated: >60 mL/min (ref >60)
Glucose, Bld: 97 mg/dL (ref 70–99)
Potassium: 3.8 mmol/L (ref 3.5–5.1)
Sodium: 138 mmol/L (ref 135–145)

## 2023-04-07 NOTE — ED Provider Notes (Signed)
[facility]    CSN: [id number] Arrival date & time: 04/07/23  1549      History   Chief Complaint Chief Complaint  Patient presents with   Ankle Pain    HPI Todd Hicks is a 52 y.o. male.   Patient originally presented for a fall and left ankle pain but I was called to physical exam room by nursing staff given patient's blood pressure was significantly elevated.  Patient's blood pressure is currently 200 systolic.  He reports that he does have a history of hypertension and previously took medication approximately 1 year ago.  He stopped taking medication given that it was too expensive and he has not seen a PCP in about a year.  He currently is denying any headache, dizziness, blurred vision, nausea, vomiting, chest pain, shortness of breath.   Ankle Pain   Past Medical History:  Diagnosis Date   Hypertension     There are no problems to display for this patient.   History reviewed. No pertinent surgical history.     Home Medications    Prior to Admission medications   Medication Sig Start Date End Date Taking? Authorizing Provider  diltiazem (CARDIZEM SR) 60 MG 12 hr capsule Take 1 capsule (60 mg total) by mouth 2 (two) times daily. Patient not taking: Reported on 04/07/2023 03/04/17   Hedges, Tinnie Gens, PA-C  hydrochlorothiazide (HYDRODIURIL) 12.5 MG tablet Take 1 tablet (12.5 mg total) by mouth daily. Patient not taking: Reported on 04/07/2023 03/11/18   Eyvonne Mechanic, PA-C    Family History History reviewed. No pertinent family history.  Social History Social History   Tobacco Use   Smoking status: Every Day    Current packs/day: 0.50    Types: Cigarettes   Smokeless tobacco: Never  Vaping Use   Vaping status: Never Used  Substance Use Topics   Alcohol use: Yes    Alcohol/week: 4.0 standard drinks of alcohol    Types: 4 Cans of beer per week   Drug use: Yes    Types: Marijuana     Allergies   Patient has no known  allergies.   Review of Systems Review of Systems Per HPI  Physical Exam Triage Vital Signs ED Triage Vitals  Encounter Vitals Group     BP 04/07/23 1655 (!) 212/133     Systolic BP Percentile --      Diastolic BP Percentile --      Pulse Rate 04/07/23 1655 95     Resp 04/07/23 1655 20     Temp 04/07/23 1655 98.8 F (37.1 C)     Temp Source 04/07/23 1655 Oral     SpO2 04/07/23 1655 94 %     Weight --      Height --      Head Circumference --      Peak Flow --      Pain Score 04/07/23 1653 8     Pain Loc --      Pain Education --      Exclude from Growth Chart --    No data found.  Updated Vital Signs BP (!) 202/127 (BP Location: Right Arm) Comment (BP Location): large cuff  Pulse 95   Temp 98.8 F (37.1 C) (Oral)   Resp 20   SpO2 94%   Visual Acuity Right Eye Distance:   Left Eye Distance:   Bilateral Distance:    Right Eye Near:   Left Eye Near:    Bilateral Near:  Physical Exam Constitutional:      General: He is not in acute distress.    Appearance: Normal appearance. He is not toxic-appearing or diaphoretic.  HENT:     Head: Normocephalic and atraumatic.  Eyes:     Extraocular Movements: Extraocular movements intact.     Conjunctiva/sclera: Conjunctivae normal.     Pupils: Pupils are equal, round, and reactive to light.  Cardiovascular:     Rate and Rhythm: Normal rate and regular rhythm.     Pulses: Normal pulses.  Pulmonary:     Effort: Pulmonary effort is normal. No respiratory distress.     Breath sounds: Normal breath sounds.  Musculoskeletal:     Comments: Deferred given priority of BP.   Neurological:     General: No focal deficit present.     Mental Status: He is alert and oriented to person, place, and time. Mental status is at baseline.     Cranial Nerves: Cranial nerves 2-12 are intact.     Sensory: Sensation is intact.     Motor: Motor function is intact.     Coordination: Coordination is intact.     Gait: Gait is intact.   Psychiatric:        Mood and Affect: Mood normal.        Behavior: Behavior normal.        Thought Content: Thought content normal.        Judgment: Judgment normal.      UC Treatments / Results  Labs (all labs ordered are listed, but only abnormal results are displayed) Labs Reviewed - No data to display  EKG   Radiology No results found.  Procedures Procedures (including critical care time)  Medications Ordered in UC Medications - No data to display  Initial Impression / Assessment and Plan / UC Course  I have reviewed the triage vital signs and the nursing notes.  Pertinent labs & imaging results that were available during my care of the patient were reviewed by me and considered in my medical decision making (see chart for details).     Patient's ankle evaluation was deferred to the ER given priority of significantly elevated blood pressure.  Patient's blood pressure was 200 systolic with recheck being similar.  Advised patient that he will need to go to the emergency department today for further evaluation and management of this blood pressure and he was agreeable with this plan.  Patient wished for his family member to transport him to the ER. Final Clinical Impressions(s) / UC Diagnoses   Final diagnoses:  Hypertensive urgency   Discharge Instructions   None    ED Prescriptions   None    PDMP not reviewed this encounter.   Gustavus Bryant, [place] 04/07/23 [phone]

## 2023-04-07 NOTE — ED Notes (Signed)
Pt called in lobby x3 with no response.

## 2023-04-07 NOTE — ED Triage Notes (Signed)
Pt here POV from UC, states he was being seen L ankle pain after he slipped and fell on Saturday. States since he has had ankle swelling since then. Sent here due to hypertension. Hx of HTN, states that he has not been able to afford taking his medication for 1 year. Denies HA, dizziness, changes in vision.

## 2023-04-07 NOTE — ED Triage Notes (Signed)
Patient fell this past Saturday.  Patient is having left ankle pain.  Patient slipped on grass and rocks on a hill.  Patient reports pain to left side of foot.  Report he has a history of gout, but does not feel like gout.  Both ankles and feet are swollen  Pain is slightly better today than yesterday.  Patient has not had any medications for pain.  Has used ice packs

## 2023-04-07 NOTE — ED Provider Triage Note (Signed)
Emergency Medicine Provider Triage Evaluation Note  Todd Hicks , a 52 y.o. male  was evaluated in triage.  Pt complains of left ankle pain and swelling after a mechanical fall.  Patient was seen at urgent care for evaluation of his left ankle, was found to be extremely hypertensive and sent to the ED for further evaluation.  Patient does have a history of high blood pressure and is supposed be on medications, but reports once his orange card ran out he was unable to afford the medication.  Denies headache dizziness, changes in vision, chest pain, shortness of breath.  Review of Systems  Positive: As above Negative: As above  Physical Exam  BP (!) 215/123 (BP Location: Left Arm)   Pulse 93   Temp 98.5 F (36.9 C) (Oral)   Resp 20   Ht 5\' 6"  (1.676 m)   Wt 104.3 kg   SpO2 96%   BMI 37.12 kg/m  Gen:   Awake, no distress   Resp:  Normal effort  MSK:   Moves extremities without difficulty  Other:    Medical Decision Making  Medically screening exam initiated at 6:11 PM.  Appropriate orders placed.  Todd Hicks was informed that the remainder of the evaluation will be completed by another provider, this initial triage assessment does not replace that evaluation, and the importance of remaining in the ED until their evaluation is complete.     Melton Alar R, PA-C 04/07/23 1816

## 2023-04-08 MED ORDER — HYDROCHLOROTHIAZIDE 25 MG PO TABS: 25.0000 mg | ORAL_TABLET | Freq: Every day | ORAL | 0 refills | Status: DC

## 2023-04-08 MED ORDER — HYDROCHLOROTHIAZIDE 12.5 MG PO TABS
12.5000 mg | ORAL_TABLET | Freq: Every day | ORAL | Status: DC
  Filled 2023-04-08: qty 1

## 2023-04-08 MED ORDER — HYDROCHLOROTHIAZIDE 12.5 MG PO TABS
12.5000 mg | ORAL_TABLET | Freq: Every day | ORAL | Status: DC
  Administered 2023-04-08: 12.5 mg via ORAL

## 2023-04-08 NOTE — Discharge Instructions (Signed)
You need to follow-up with the orthopedic doctor listed.  Wear the boot until you see them.   Take your blood pressure medication as directed.    Our social worker should contact you about getting a doctor and to help you get the medications.

## 2023-04-08 NOTE — ED Notes (Signed)
Patient states he was sent for hypertension. Denies CP, SOB, dizziness, headache.

## 2023-04-08 NOTE — Progress Notes (Signed)
Orthopedic Tech Progress Note Patient Details:  Todd Hicks 03/29/1971 [id number]  Applied CAM walker Ortho Devices Type of Ortho Device: CAM walker Ortho Device/Splint Location: LUE Ortho Device/Splint Interventions: Ordered, Application, Adjustment   Post Interventions Patient Tolerated: Well Instructions Provided: Care of device, Adjustment of device  Sherilyn Banker 04/08/2023, 1:42 AM

## 2023-04-08 NOTE — ED Provider Notes (Signed)
[facility] Emergency Department Provider Note MRN:  [id number]  Arrival date & time: 04/08/23     Chief Complaint   Hypertension   History of Present Illness   Todd Hicks is a 52 y.o. year-old male presents to the ED with chief complaint of left ankle pain.  States that he fell and twisted his ankle a couple of days ago.  He has been walking on it, but due to the persistent pain, he sought treatment at an urgent care.  He was referred to the ER due to elevated blood pressure.  He states that he isn't having any symptoms other than his ankle pain.  Denies headache, dizziness, vision or speech changes, numbness, weakness, tingling, sOB, or chest pain.  States that he is out of his BP meds because he can't afford them.  History provided by patient.   Review of Systems  Pertinent positive and negative review of systems noted in HPI.    Physical Exam   Vitals:   04/08/23 0015 04/08/23 0130  BP: (!) 162/143 (!) 173/124  Pulse: 93 79  Resp: 18 (!) 22  Temp: 98.4 F (36.9 C)   SpO2: 100% 97%    CONSTITUTIONAL:  well-appearing, NAD NEURO:  Alert and oriented x 3, CN 3-12 grossly intact EYES:  eyes equal and reactive ENT/NECK:  Supple, no stridor  CARDIO:  normal rate, regular rhythm, appears well-perfused  PULM:  No respiratory distress, CTAB GI/GU:  non-distended,  MSK/SPINE:  No gross deformities, mild bruising and swelling to the left ankle with some tenderness medially SKIN:  no rash, atraumatic   *Additional and/or pertinent findings included in MDM below  Diagnostic and Interventional Summary    EKG Interpretation Date/Time:    Ventricular Rate:    PR Interval:    QRS Duration:    QT Interval:    QTC Calculation:   R Axis:      Text Interpretation:         Labs Reviewed  CBC WITH DIFFERENTIAL/PLATELET - Abnormal; Notable for the following components:      Result Value   WBC 12.1 (*)    Neutro Abs 9.5 (*)    Monocytes Absolute  1.1 (*)    All other components within normal limits  BASIC METABOLIC PANEL - Abnormal; Notable for the following components:   CO2 19 (*)    Anion gap 16 (*)    All other components within normal limits    DG Ankle Complete Left  Final Result      Medications  hydrochlorothiazide (HYDRODIURIL) tablet 12.5 mg (12.5 mg Oral Given 04/08/23 0145)     Procedures  /  Critical Care Procedures  ED Course and Medical Decision Making  I have reviewed the triage vital signs, the nursing notes, and pertinent available records from the EMR.  Social Determinants Affecting Complexity of Care: Patient has no clinically significant social determinants affecting this chief complaint..   ED Course: Clinical Course as of 04/08/23 0201  Tue Apr 08, 2023  0200 DG Ankle Complete Left Avulsion of medial malleolus [RB]    Clinical Course User Index [RB] Roxy Horseman, PA-C    Medical Decision Making Patient here with left ankle pain. He has an avulsion fracture seen on x-ray.  He has been ambulating on it.  Will give cam boot.  Recommend ortho follow-up.  Patient has high BP.  Not on meds due to finances.  Asymptomatic.  Will give home dose hydrochlorothiazide.  Rx set to  pharmacy.   SW/CM consulted for assistance with PCP and meds.  Risk Prescription drug management.         Consultants: No consultations were needed in caring for this patient.   Treatment and Plan: I considered admission due to patient's initial presentation, but after considering the examination and diagnostic results, patient will not require admission and can be discharged with outpatient follow-up.    Final Clinical Impressions(s) / ED Diagnoses     ICD-10-CM   1. Closed fracture of left ankle, initial encounter  S82.892A     2. Elevated blood pressure reading  R03.0       ED Discharge Orders          Ordered    hydrochlorothiazide (HYDRODIURIL) 25 MG tablet  Daily        04/08/23 0155               Discharge Instructions Discussed with and Provided to Patient:    Discharge Instructions      You need to follow-up with the orthopedic doctor listed.  Wear the boot until you see them.   Take your blood pressure medication as directed.    Our social worker should contact you about getting a doctor and to help you get the medications.      Roxy Horseman, PA-C 04/08/23 0201    Gloris Manchester, MD 04/09/23 [phone]

## 2023-06-26 ENCOUNTER — Ambulatory Visit: Payer: No Typology Code available for payment source | Admitting: Family Medicine

## 2023-07-14 ENCOUNTER — Ambulatory Visit: Payer: No Typology Code available for payment source | Admitting: Internal Medicine

## 2023-07-21 ENCOUNTER — Encounter: Payer: Self-pay | Admitting: Internal Medicine

## 2023-08-06 ENCOUNTER — Ambulatory Visit: Payer: No Typology Code available for payment source | Admitting: Internal Medicine

## 2023-10-23 ENCOUNTER — Encounter: Payer: Self-pay | Admitting: Internal Medicine

## 2023-10-23 ENCOUNTER — Ambulatory Visit: Payer: No Typology Code available for payment source | Admitting: Internal Medicine

## 2023-10-23 VITALS — BP 190/120 | HR 111 | Temp 98.4°F | Ht 62.0 in | Wt 239.8 lb

## 2023-10-23 DIAGNOSIS — Z1211 Encounter for screening for malignant neoplasm of colon: Secondary | ICD-10-CM

## 2023-10-23 DIAGNOSIS — I10 Essential (primary) hypertension: Secondary | ICD-10-CM | POA: Diagnosis not present

## 2023-10-23 LAB — LIPID PANEL
Cholesterol: 280 mg/dL — ABNORMAL HIGH (ref 0–200)
HDL: 129.1 mg/dL (ref >39.00)
LDL Cholesterol: 139 mg/dL — ABNORMAL HIGH (ref 0–99)
NonHDL: 150.42
Total CHOL/HDL Ratio: 2
Triglycerides: 58 mg/dL (ref 0.0–149.0)
VLDL: 11.6 mg/dL (ref 0.0–40.0)

## 2023-10-23 LAB — CBC WITH DIFFERENTIAL/PLATELET
Basophils Absolute: 0 10*3/uL (ref 0.0–0.1)
Basophils Relative: 0.3 % (ref 0.0–3.0)
Eosinophils Absolute: 0.1 10*3/uL (ref 0.0–0.7)
Eosinophils Relative: 1 % (ref 0.0–5.0)
HCT: 43.4 % (ref 39.0–52.0)
Hemoglobin: 14.8 g/dL (ref 13.0–17.0)
Lymphocytes Relative: 21.5 % (ref 12.0–46.0)
Lymphs Abs: 1.7 10*3/uL (ref 0.7–4.0)
MCHC: 34 g/dL (ref 30.0–36.0)
MCV: 97 fl (ref 78.0–100.0)
Monocytes Absolute: 1 10*3/uL (ref 0.1–1.0)
Monocytes Relative: 12.3 % — ABNORMAL HIGH (ref 3.0–12.0)
Neutro Abs: 5.3 10*3/uL (ref 1.4–7.7)
Neutrophils Relative %: 64.9 % (ref 43.0–77.0)
Platelets: 182 10*3/uL (ref 150.0–400.0)
RBC: 4.47 Mil/uL (ref 4.22–5.81)
RDW: 14.1 % (ref 11.5–15.5)
WBC: 8.1 10*3/uL (ref 4.0–10.5)

## 2023-10-23 LAB — COMPREHENSIVE METABOLIC PANEL WITH GFR
ALT: 64 U/L — ABNORMAL HIGH (ref 0–53)
AST: 57 U/L — ABNORMAL HIGH (ref 0–37)
Albumin: 4 g/dL (ref 3.5–5.2)
Alkaline Phosphatase: 82 U/L (ref 39–117)
BUN: 14 mg/dL (ref 6–23)
CO2: 24 meq/L (ref 19–32)
Calcium: 9.1 mg/dL (ref 8.4–10.5)
Chloride: 101 meq/L (ref 96–112)
Creatinine, Ser: 1.07 mg/dL (ref 0.40–1.50)
GFR: 79.79 mL/min (ref >60.00)
Glucose, Bld: 88 mg/dL (ref 70–99)
Potassium: 4.4 meq/L (ref 3.5–5.1)
Sodium: 135 meq/L (ref 135–145)
Total Bilirubin: 0.5 mg/dL (ref 0.2–1.2)
Total Protein: 8.1 g/dL (ref 6.0–8.3)

## 2023-10-23 LAB — HEMOGLOBIN A1C: Hgb A1c MFr Bld: 5.8 % (ref 4.6–6.5)

## 2023-10-23 MED ORDER — VALSARTAN-HYDROCHLOROTHIAZIDE 160-25 MG PO TABS: 1.0000 | ORAL_TABLET | Freq: Every day | ORAL | 1 refills | Status: DC

## 2023-10-23 MED ORDER — AMLODIPINE BESYLATE 5 MG PO TABS: 5.0000 mg | ORAL_TABLET | Freq: Every day | ORAL | 1 refills | Status: AC

## 2023-10-23 MED ORDER — VALSARTAN-HYDROCHLOROTHIAZIDE 160-25 MG PO TABS
1.0000 | ORAL_TABLET | Freq: Every day | ORAL | 1 refills | Status: DC
Start: 2023-10-23 — End: 2023-10-23

## 2023-10-23 MED ORDER — AMLODIPINE BESYLATE 5 MG PO TABS
5.0000 mg | ORAL_TABLET | Freq: Every day | ORAL | 1 refills | Status: DC
Start: 2023-10-23 — End: 2023-10-23

## 2023-10-23 NOTE — Progress Notes (Signed)
 New Patient Office Visit     CC/Reason for Visit: Establish care, discuss blood pressure Previous PCP: None Last Visit: Unknown  HPI: Todd Hicks is a 53 y.o. male who is coming in today for the above mentioned reasons. Past Medical History is significant for: Hypertension.  He does not have routine medical care.  He has not been treated for his blood pressure in years.  He denies chest pain, shortness of breath, headache, focal neurologic deficits.  He smokes about 2 cigarettes a day, drinks 2-3 beers a day, no allergies, no past surgical history or family history.  He is overdue for all vaccinations and age-appropriate cancer screenings.   Past Medical/Surgical History: Past Medical History:  Diagnosis Date   Hypertension    Morbid obesity (HCC)     History reviewed. No pertinent surgical history.  Social History:  reports that he has been smoking cigarettes. He has never used smokeless tobacco. He reports current alcohol use of about 4.0 standard drinks of alcohol per week. He reports current drug use. Drug: Marijuana.  Allergies: No Known Allergies  Family History:  History reviewed. No pertinent family history.   Current Outpatient Medications:    amLODipine  (NORVASC ) 5 MG tablet, Take 1 tablet (5 mg total) by mouth daily., Disp: 90 tablet, Rfl: 1   valsartan -hydrochlorothiazide  (DIOVAN -HCT) 160-25 MG tablet, Take 1 tablet by mouth daily., Disp: 90 tablet, Rfl: 1  Review of Systems:  Negative except as indicated in HPI.   Physical Exam: Vitals:   10/23/23 1408 10/23/23 1410  BP: (!) 180/118 (!) 190/120  Pulse: (!) 111   Temp: 98.4 F (36.9 C)   TempSrc: Oral   SpO2: 98%   Weight: 239 lb 12.8 oz (108.8 kg)   Height: 5\' 2"  (1.575 m)    Body mass index is 43.86 kg/m.  Physical Exam Vitals reviewed.  Constitutional:      Appearance: Normal appearance. He is obese.  HENT:     Head: Normocephalic and atraumatic.  Eyes:     Conjunctiva/sclera:  Conjunctivae normal.  Cardiovascular:     Rate and Rhythm: Normal rate and regular rhythm.  Pulmonary:     Effort: Pulmonary effort is normal.     Breath sounds: Normal breath sounds.  Skin:    General: Skin is warm and dry.  Neurological:     General: No focal deficit present.     Mental Status: He is alert and oriented to person, place, and time.  Psychiatric:        Mood and Affect: Mood normal.        Behavior: Behavior normal.        Thought Content: Thought content normal.        Judgment: Judgment normal.     Flowsheet Row Office Visit from 10/23/2023 in [facility] [place]  PHQ-9 Total Score 3        Impression and Plan:  Primary hypertension -     CBC with Differential/Platelet; Future -     Comprehensive metabolic panel with GFR; Future -     Lipid panel; Future -     Hemoglobin A1c; Future -     amLODIPine  Besylate; Take 1 tablet (5 mg total) by mouth daily.  Dispense: 90 tablet; Refill: 1 -     Valsartan -hydroCHLOROthiazide ; Take 1 tablet by mouth daily.  Dispense: 90 tablet; Refill: 1  Screening for malignant neoplasm of colon -     Ambulatory referral to Gastroenterology  Morbid  obesity (HCC)   - Very elevated blood pressure today.  We have decided to focus on this.  Lifestyle modifications discussed.  He will do ambulatory blood pressure monitoring.  Start on amlodipine  5 mg and Diovan  HCT 160/25 mg daily.  He will return in 2 weeks for follow-up.  Basic labs requested. -Discussed healthy lifestyle, including increased physical activity and better food choices to promote weight loss.   Time spent: 46 minutes reviewing chart, interviewing examining patient and formulating plan of care.      Marguerita Shih, MD [facility]

## 2023-10-23 NOTE — Addendum Note (Signed)
 Addended by: Nicolina Barrios B on: 10/23/2023 02:38 PM   Modules accepted: Orders

## 2023-10-27 ENCOUNTER — Encounter: Payer: Self-pay | Admitting: Internal Medicine

## 2023-10-27 DIAGNOSIS — E785 Hyperlipidemia, unspecified: Secondary | ICD-10-CM | POA: Insufficient documentation

## 2023-10-27 DIAGNOSIS — R7401 Elevation of levels of liver transaminase levels: Secondary | ICD-10-CM | POA: Insufficient documentation

## 2023-10-27 DIAGNOSIS — R7302 Impaired glucose tolerance (oral): Secondary | ICD-10-CM | POA: Insufficient documentation

## 2023-10-28 ENCOUNTER — Ambulatory Visit (INDEPENDENT_AMBULATORY_CARE_PROVIDER_SITE_OTHER)

## 2023-10-28 ENCOUNTER — Ambulatory Visit (HOSPITAL_COMMUNITY)
Admission: EM | Admit: 2023-10-28 | Discharge: 2023-10-28 | Disposition: A | Discharge status: Home / Self Care | Attending: Physician Assistant | Admitting: Physician Assistant

## 2023-10-28 ENCOUNTER — Encounter (HOSPITAL_COMMUNITY): Payer: Self-pay

## 2023-10-28 DIAGNOSIS — M109 Gout, unspecified: Secondary | ICD-10-CM

## 2023-10-28 DIAGNOSIS — M25571 Pain in right ankle and joints of right foot: Secondary | ICD-10-CM | POA: Diagnosis not present

## 2023-10-28 DIAGNOSIS — I10 Essential (primary) hypertension: Secondary | ICD-10-CM

## 2023-10-28 MED ORDER — COLCHICINE 0.6 MG PO TABS
ORAL_TABLET | ORAL | 0 refills | Status: AC
Start: 2023-10-28 — End: ?

## 2023-10-28 NOTE — ED Provider Notes (Signed)
 [facility]    CSN: [id number] Arrival date & time: 10/28/23  0850      History   Chief Complaint Chief Complaint  Patient presents with   Foot Pain    HPI Todd Hicks is a 53 y.o. male.   Patient presents today with a weeklong history of right ankle pain.  He denies any known injury or increase in activity prior to symptom onset.  He does not remember an injury but does have pain in a focal area near his lateral malleolus and is unsure if he could have twisted his ankle.  Pain is rated 10 on a 0-10 pain scale, described as sharp, no alleviating factors identified.  He denies any numbness or paresthesias in the foot.  He does have history of gout but has never sought medical attention during episodes.  He did recently establish with primary care provider and was started on several blood pressure medications including amlodipine  and valsartan /hydrochlorothiazide .  He reports that the ankle pain was present before he started taking hydrochlorothiazide .  He denies any recent dietary or other medication changes.  He has tried Tylenol  without improvement of symptoms.  He denies any fever, nausea, vomiting.  Patient's blood pressure is very elevated.  He was recently started on hypertensive therapy within the past 48 hours.  He denies any chest pain, shortness of breath, headache, visual disturbance, leg swelling, lightheadedness.  His blood pressure was similarly elevated at his primary care appointment several days ago.  At that time he had blood work including metabolic panel that showed normal kidney function.    Past Medical History:  Diagnosis Date   Hypertension    Morbid obesity [facility])     Patient Active Problem List   Diagnosis Date Noted   IGT (impaired glucose tolerance) 10/27/2023   Hyperlipidemia 10/27/2023   Transaminitis 10/27/2023   Hypertension    Morbid obesity (HCC)     History reviewed. No pertinent surgical history.     Home Medications    Prior  to Admission medications   Medication Sig Start Date End Date Taking? Authorizing Provider  amLODipine  (NORVASC ) 5 MG tablet Take 1 tablet (5 mg total) by mouth daily. 10/23/23  Yes Zilphia Hilt, Charyl Coppersmith, MD  colchicine 0.6 MG tablet Take 2 tablets (1.2 mg) daily on day 1 then 1 tablet (0.6 mg) daily thereafter until gone 10/28/23  Yes Jizel Cheeks K, PA-C  valsartan -hydrochlorothiazide  (DIOVAN -HCT) 160-25 MG tablet Take 1 tablet by mouth daily. 10/23/23  Yes Zilphia Hilt, Charyl Coppersmith, MD    Family History History reviewed. No pertinent family history.  Social History Social History   Tobacco Use   Smoking status: Every Day    Current packs/day: 0.50    Types: Cigarettes   Smokeless tobacco: Never  Vaping Use   Vaping status: Never Used  Substance Use Topics   Alcohol use: Yes    Alcohol/week: 4.0 standard drinks of alcohol    Types: 4 Cans of beer per week   Drug use: Yes    Types: Marijuana     Allergies   Patient has no known allergies.   Review of Systems Review of Systems  Constitutional:  Positive for activity change. Negative for appetite change, fatigue and fever.  Respiratory:  Negative for shortness of breath.   Cardiovascular:  Negative for chest pain, palpitations and leg swelling.  Gastrointestinal:  Negative for abdominal pain, diarrhea, nausea and vomiting.  Musculoskeletal:  Positive for arthralgias, gait problem and joint swelling. Negative  for myalgias.  Skin:  Negative for color change and wound.  Neurological:  Negative for weakness and numbness.     Physical Exam Triage Vital Signs ED Triage Vitals  Encounter Vitals Group     BP 10/28/23 0907 (!) 190/112     Systolic BP Percentile --      Diastolic BP Percentile --      Pulse Rate 10/28/23 0907 (!) 111     Resp 10/28/23 0907 18     Temp 10/28/23 0907 98.2 F (36.8 C)     Temp Source 10/28/23 0907 Oral     SpO2 10/28/23 0907 94 %     Weight --      Height --      Head Circumference --       Peak Flow --      Pain Score 10/28/23 0908 10     Pain Loc --      Pain Education --      Exclude from Growth Chart --    No data found.  Updated Vital Signs BP (!) 171/121 (BP Location: Right Arm)   Pulse (!) 105   Temp 98.2 F (36.8 C) (Oral)   Resp 18   SpO2 96%   Visual Acuity Right Eye Distance:   Left Eye Distance:   Bilateral Distance:    Right Eye Near:   Left Eye Near:    Bilateral Near:     Physical Exam Vitals reviewed.  Constitutional:      General: He is awake.     Appearance: Normal appearance. He is well-developed. He is not ill-appearing.     Comments: Very pleasant male appears stated age in no acute distress sitting comfortably in exam room  HENT:     Head: Normocephalic and atraumatic.  Cardiovascular:     Rate and Rhythm: Normal rate and regular rhythm.     Heart sounds: Normal heart sounds, S1 normal and S2 normal. No murmur heard.    Comments: Negative Celine Collard' sign on the right.  1+ pitting edema to right ankle. Pulmonary:     Effort: Pulmonary effort is normal.     Breath sounds: Normal breath sounds. No stridor. No wheezing, rhonchi or rales.     Comments: Clear to auscultation bilaterally Musculoskeletal:     Right ankle: Swelling present. Tenderness present over the lateral malleolus. Decreased range of motion.     Right Achilles Tendon: No tenderness. Thompson's test negative.     Comments: Right ankle: Significant tenderness palpation over lateral malleolus.  No deformity noted.  Strength 5/5 at ankle.  Foot is neurovascularly intact.  Skin:    Findings: No erythema or wound.  Neurological:     Mental Status: He is alert.  Psychiatric:        Behavior: Behavior is cooperative.      UC Treatments / Results  Labs (all labs ordered are listed, but only abnormal results are displayed) Labs Reviewed - No data to display  EKG   Radiology DG Ankle Complete Right Result Date: 10/28/2023 CLINICAL DATA:  Right ankle pain and  swelling.  No known injury. EXAM: RIGHT ANKLE - COMPLETE 3+ VIEW COMPARISON:  08/31/2018 FINDINGS: Diffuse soft tissue swelling. No fracture or dislocation. Possible small effusion. IMPRESSION: Diffuse soft tissue swelling and possible small effusion. Electronically Signed   By: Catherin Closs M.D.   On: 10/28/2023 10:22    Procedures Procedures (including critical care time)  Medications Ordered in UC Medications - No data to  display  Initial Impression / Assessment and Plan / UC Course  I have reviewed the triage vital signs and the nursing notes.  Pertinent labs & imaging results that were available during my care of the patient were reviewed by me and considered in my medical decision making (see chart for details).     Patient is hypertensive and was initially tachycardic but this improved following sitting quietly for several minutes based on his EKG.  He was afebrile, nontoxic, well-appearing.  X-ray was obtained given bony tenderness that showed no acute osseous abnormality.  Low suspicion for septic arthritis given his clinical presentation and that he is afebrile.  He does have several risk factors for gout and has had similar presentations that were diagnosed as gout so I suspect that a gout flare is contributing to his symptoms.  Given his hypertension we will avoid prednisone and NSAIDs, use colchicine.  No indication for dose adjustment based on metabolic panel from 10/23/2022 with creatinine of 1.07 and calculated creatinine clearance of 123.83 mL/min.  He was encouraged to use elevation for additional symptom relief.  Recommend close follow-up with his primary care.  We discussed that if anything worsens or changes needs to be seen immediately.  Strict return precautions given.  All questions were answered to patient satisfaction.  Work excuse note provided.  Blood pressure is very elevated.  Patient has been taking antihypertensive therapy but only for the past few days.  He is now  established with a primary care and had blood work on 06/29/2023 that showed normal kidney function so additional labs were not obtained today.  EKG was obtained given significantly elevated blood pressure that showed normal sinus rhythm with ventricular rate of 97 bpm without ischemic changes; compared to 04/07/2023 tracing he now has upright T waves in V1 and delayed R wave progression.  He is asymptomatic and so ER evaluation was deferred but I did recommend that he follow-up with a cardiologist and he was given the contact information for local provider with instruction to call to schedule an appointment.  Is to avoid decongestants, caffeine, sodium, NSAIDs.  Recommended he monitor his blood pressure at home and keep log for evaluation of follow-up with primary care.  He is to follow-up with primary care within the next few weeks to address his medications.  Discussed that if anything worsens or changes and he develops headache, dizziness, chest pain, shortness of breath, visual disturbance in setting of high blood pressure he needs to go to the ER.  Strict return precautions given.  All questions were answered to patient and wife satisfaction.  Final Clinical Impressions(s) / UC Diagnoses   Final diagnoses:  Acute right ankle pain  Acute gout of right ankle, unspecified cause  Elevated blood pressure reading with diagnosis of hypertension     Discharge Instructions      Start colchicine as prescribed to treat for what I suspect is a gout flare.  Keep your leg elevated and avoid lots of walking.  Follow-up with your primary care as soon as possible.  If anything worsens and you develop redness, worsening swelling, increasing pain, fever, nausea, vomiting you need to be seen immediately.  As we discussed, it is possible hydrochlorothiazide  is contributing to your symptoms.  When you follow-up with your primary care please discuss with them the potential need to change your blood pressure  medication.  Your blood pressure is elevated.  Monitor your blood pressure at home and if this is persistently elevated please  return here or see your primary care to consider medication adjustment.  Avoid decongestants, caffeine, sodium, NSAIDs (aspirin, ibuprofen/Advil, naproxen/Aleve).  If you develop any chest pain, shortness of breath, headache, vision change, dizziness in the setting of high blood pressure you need to be seen immediately.  Follow-up with cardiology.     ED Prescriptions     Medication Sig Dispense Auth. Provider   colchicine 0.6 MG tablet Take 2 tablets (1.2 mg) daily on day 1 then 1 tablet (0.6 mg) daily thereafter until gone 6 tablet Prudencio Velazco K, PA-C      PDMP not reviewed this encounter.   Budd Cargo, PA-C 10/28/23 1059

## 2023-10-28 NOTE — ED Triage Notes (Signed)
 Pt c/o burning pain to rt side of upper foot x1wk. Denies injury. States hx of gout and feels the same. Denies taken any meds for sx's.

## 2023-10-28 NOTE — Discharge Instructions (Addendum)
 Start colchicine as prescribed to treat for what I suspect is a gout flare.  Keep your leg elevated and avoid lots of walking.  Follow-up with your primary care as soon as possible.  If anything worsens and you develop redness, worsening swelling, increasing pain, fever, nausea, vomiting you need to be seen immediately.  As we discussed, it is possible hydrochlorothiazide  is contributing to your symptoms.  When you follow-up with your primary care please discuss with them the potential need to change your blood pressure medication.  Your blood pressure is elevated.  Monitor your blood pressure at home and if this is persistently elevated please return here or see your primary care to consider medication adjustment.  Avoid decongestants, caffeine, sodium, NSAIDs (aspirin, ibuprofen/Advil, naproxen/Aleve).  If you develop any chest pain, shortness of breath, headache, vision change, dizziness in the setting of high blood pressure you need to be seen immediately.  Follow-up with cardiology.

## 2023-10-29 ENCOUNTER — Encounter: Payer: Self-pay | Admitting: Pediatrics

## 2023-11-06 ENCOUNTER — Ambulatory Visit: Admitting: Internal Medicine

## 2023-11-12 ENCOUNTER — Telehealth: Payer: Self-pay

## 2023-11-12 ENCOUNTER — Encounter

## 2023-11-12 NOTE — Telephone Encounter (Signed)
 Patient was called three times for PV. No answer. Messages were left to call before 5:00 today to reschedule PV. If patient does not call a no show letter will be mailed and his procedure cancelled per  [facility] guidelines.   Luanna Rung, LPN ( PV )

## 2023-11-28 ENCOUNTER — Encounter: Admitting: Pediatrics

## 2024-02-18 ENCOUNTER — Ambulatory Visit: Payer: Self-pay

## 2024-02-18 NOTE — Telephone Encounter (Signed)
 FYI Only or Action Required?: FYI only for provider.  Patient was last seen in primary care on 10/23/2023 by Theophilus Andrews, Tully GRADE, MD.  Called Nurse Triage reporting Rectal Bleeding.  Symptoms began several weeks ago.  Interventions attempted: Nothing.  Symptoms are: unchanged.  Triage Disposition: Go to ED Now (Notify PCP)  Patient/caregiver understands and will follow disposition?: YesCopied from CRM [id number]. Topic: Clinical - Red Word Triage >> Feb 18, 2024  3:34 PM Mesmerise C wrote: Red Word that prompted transfer to Nurse Triage: Patient stated when uses the restroom there's blood in his stool also burns when using restroom, been ongoing for 2-3 weeks Reason for Disposition  [1] MODERATE rectal bleeding (e.g., small blood clots, passing blood without stool, or toilet water turns red) AND [2] more than once a day  Answer Assessment - Initial Assessment Questions Multiple bloody stools over last 2 weeks. Denies any clots. RN advised ED and to call back to make f/u with PCP. Pt stated he would go.      1. APPEARANCE of BLOOD: What color is it? Is it passed separately, on the surface of the stool, or mixed in with the stool?      Mixed- bright red 2. AMOUNT: How much blood was passed?      Mixed with stool 3. FREQUENCY: How many times has blood been passed with the stools?      Not sure 4. ONSET: When was the blood first seen in the stools? (Days or weeks)      2 weeks- off and on 5. DIARRHEA: Is there also some diarrhea? If Yes, ask: How many diarrhea stools in the past 24 hours?      denies 6. CONSTIPATION: Do you have constipation? If Yes, ask: How bad is it?     denies 7. RECURRENT SYMPTOMS: Have you had blood in your stools before? If Yes, ask: When was the last time? and What happened that time?      denies 8. BLOOD THINNERS: Do you take any blood thinners? (e.g., aspirin, clopidogrel / Plavix, coumadin, heparin). Notes: Other strong  blood thinners include: Arixtra (fondaparinux), Eliquis (apixaban), Pradaxa (dabigatran), and Xarelto (rivaroxaban).     no 9. OTHER SYMPTOMS: Do you have any other symptoms?  (e.g., abdomen pain, vomiting, dizziness, fever)     denies  Protocols used: Rectal Bleeding-A-AH

## 2024-06-02 ENCOUNTER — Other Ambulatory Visit: Payer: Self-pay | Admitting: Internal Medicine

## 2024-06-02 DIAGNOSIS — I10 Essential (primary) hypertension: Secondary | ICD-10-CM
